# Patient Record
Sex: Female | Born: 1935 | Race: White | Hispanic: No | Marital: Married
Health system: Southern US, Community
[De-identification: ages and names within clinical notes are randomized; demographics above are authoritative.]

---

## 2004-10-14 ENCOUNTER — Ambulatory Visit: Payer: Self-pay | Admitting: Internal Medicine

## 2005-03-02 ENCOUNTER — Emergency Department: Payer: Self-pay | Admitting: Emergency Medicine

## 2005-04-15 ENCOUNTER — Ambulatory Visit: Payer: Self-pay | Admitting: Internal Medicine

## 2005-10-20 ENCOUNTER — Ambulatory Visit: Payer: Self-pay | Admitting: Internal Medicine

## 2006-10-22 ENCOUNTER — Ambulatory Visit: Payer: Self-pay | Admitting: Internal Medicine

## 2007-04-06 ENCOUNTER — Ambulatory Visit: Payer: Self-pay | Admitting: Internal Medicine

## 2007-04-29 ENCOUNTER — Ambulatory Visit: Payer: Self-pay | Admitting: Gastroenterology

## 2007-06-15 ENCOUNTER — Emergency Department: Payer: Self-pay | Admitting: Emergency Medicine

## 2007-06-15 ENCOUNTER — Other Ambulatory Visit: Payer: Self-pay

## 2007-06-16 ENCOUNTER — Ambulatory Visit: Payer: Self-pay | Admitting: Emergency Medicine

## 2007-10-25 ENCOUNTER — Ambulatory Visit: Payer: Self-pay | Admitting: Internal Medicine

## 2008-08-09 IMAGING — CR DG CHEST 2V
1 series · 2 of 2 positions shown · non-contrast
Comparison: none

REASON FOR EXAM: pain
COMMENTS:

[Series 1: view not recorded · 0.17mm/px · 2 of 2 slices shown]
[im 1/2]
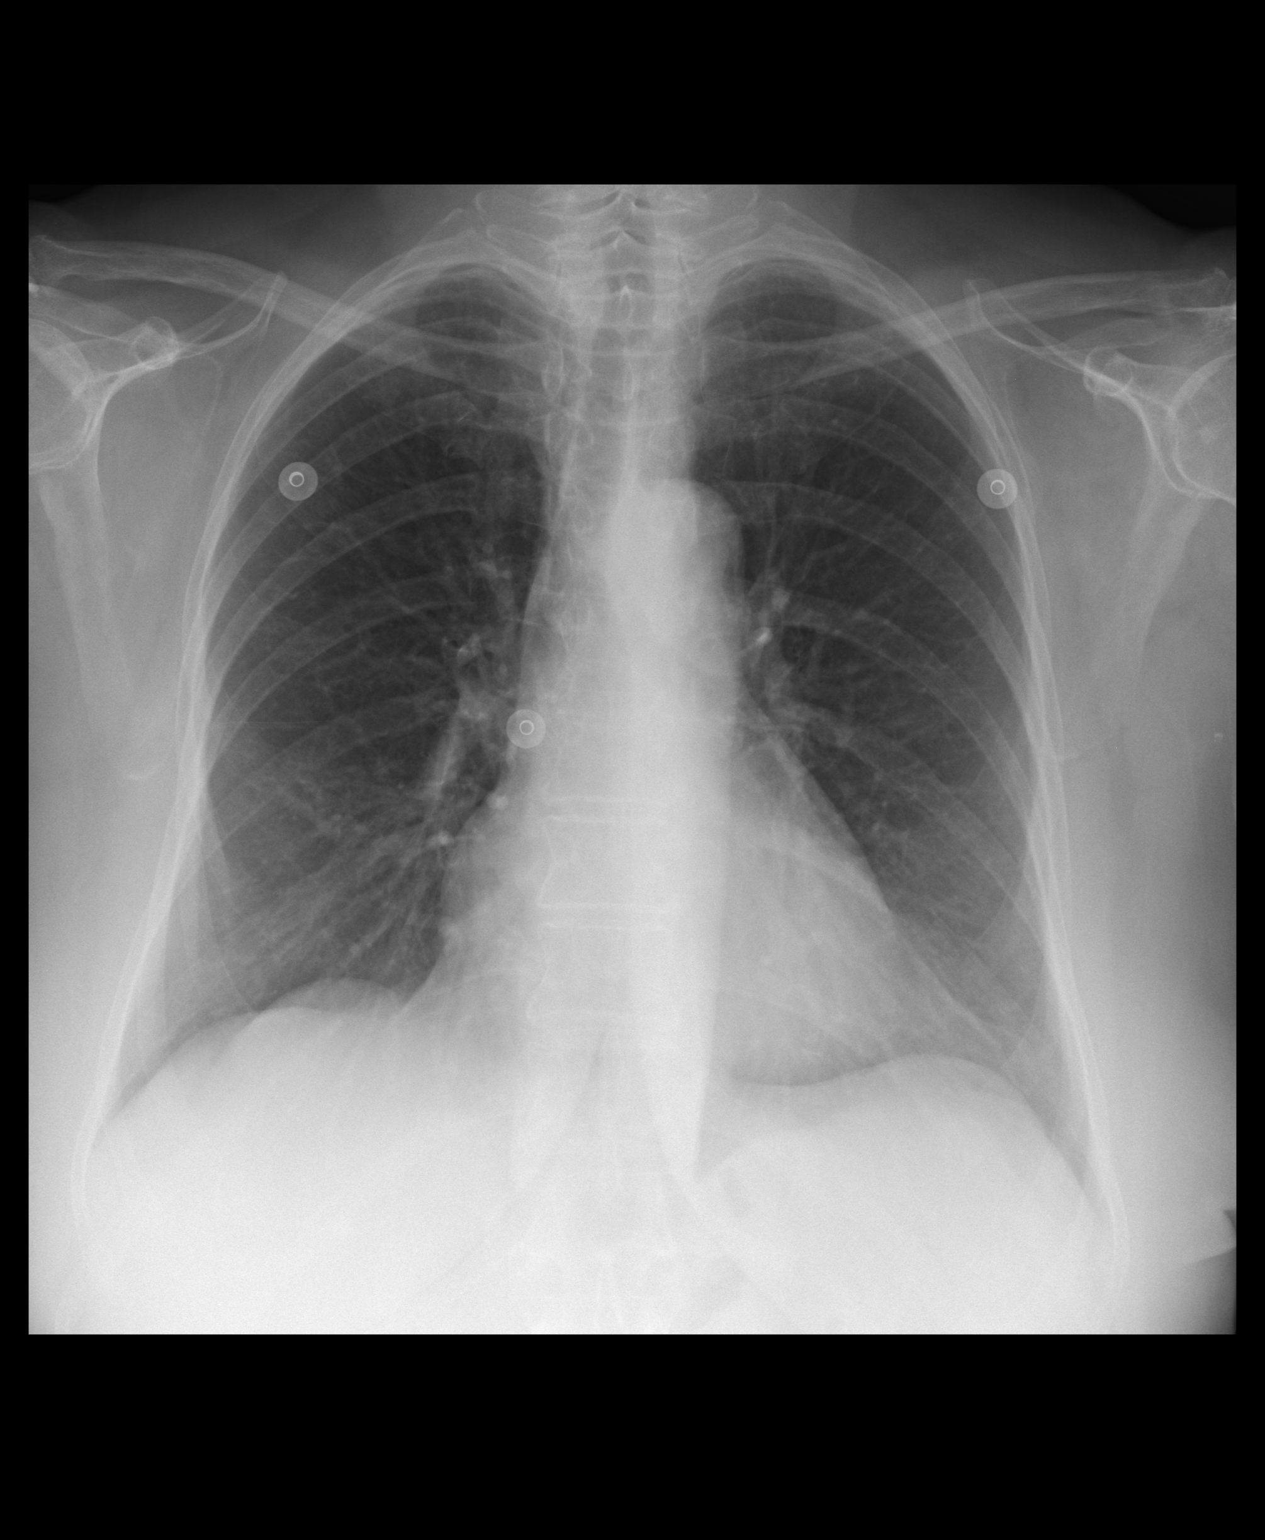
[im 2/2]
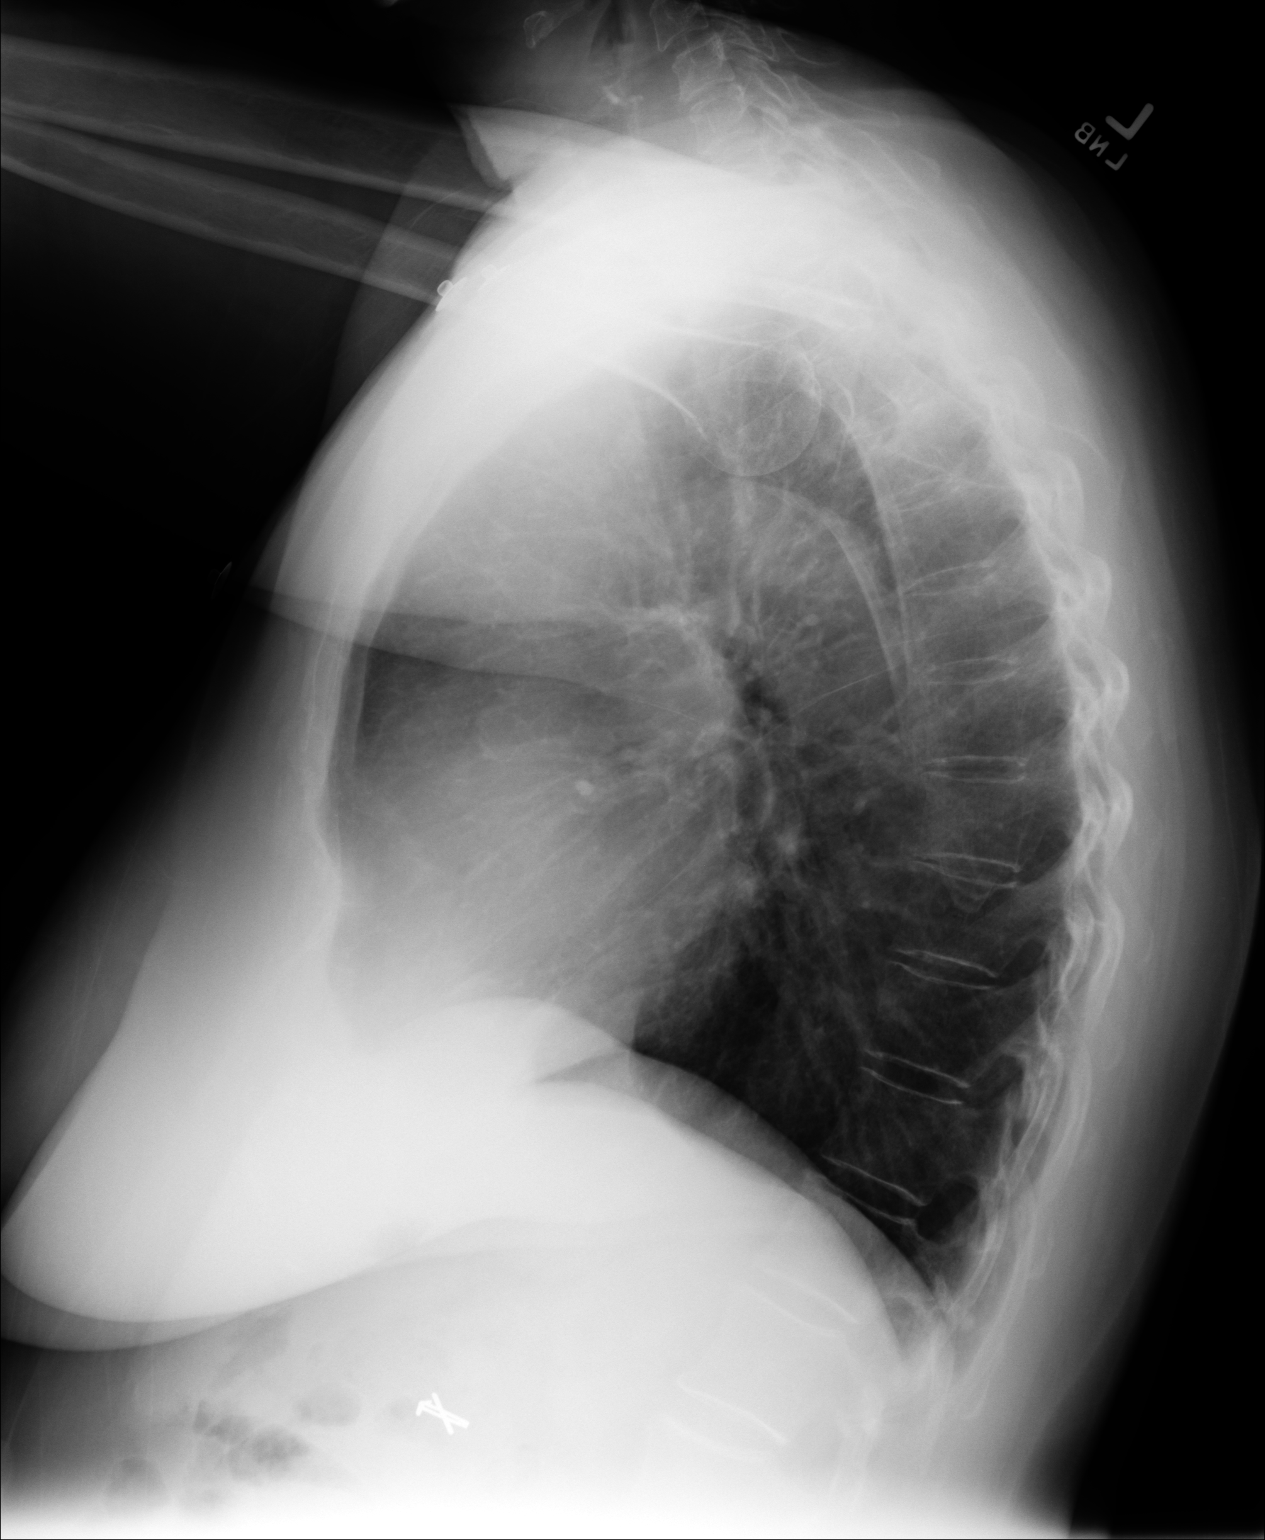

[2 of 2 positions shown; findings below may reference images not displayed]

PROCEDURE:     DXR - DXR CHEST PA (OR AP) AND LATERAL  - June 15, 2007  [DATE]

RESULT:     PA and lateral views of the chest show the lung fields to be
clear. No pneumonia, pneumothorax or pleural effusion is seen. Heart size is
normal. The mediastinal and osseous structures show no significant
abnormalities.
IMPRESSION: 1.     No significant abnormalities are noted.

## 2008-10-26 ENCOUNTER — Ambulatory Visit: Payer: Self-pay | Admitting: Internal Medicine

## 2010-02-14 ENCOUNTER — Ambulatory Visit: Payer: Self-pay | Admitting: Internal Medicine

## 2011-01-27 ENCOUNTER — Ambulatory Visit: Payer: Self-pay | Admitting: Internal Medicine

## 2011-02-19 ENCOUNTER — Ambulatory Visit: Payer: Self-pay | Admitting: Internal Medicine

## 2012-03-19 ENCOUNTER — Ambulatory Visit: Payer: Self-pay | Admitting: Internal Medicine

## 2012-05-30 ENCOUNTER — Emergency Department: Payer: Self-pay | Admitting: *Deleted

## 2012-10-15 ENCOUNTER — Emergency Department: Payer: Self-pay | Admitting: Emergency Medicine

## 2013-04-11 ENCOUNTER — Ambulatory Visit: Payer: Self-pay

## 2013-12-10 IMAGING — CR DG SHOULDER 3+V*R*
1 series · 3 of 3 positions shown · non-contrast
Comparison: none

REASON FOR EXAM: fall
COMMENTS:   May transport without cardiac monitor

PROCEDURE:     DXR - DXR SHOULDER RIGHT COMPLETE  - October 15, 2012  [DATE]
RESULT:

[Series 1: w shoulder internal right · 0.14mm/px · 3 of 3 slices shown]
[im 1/3]
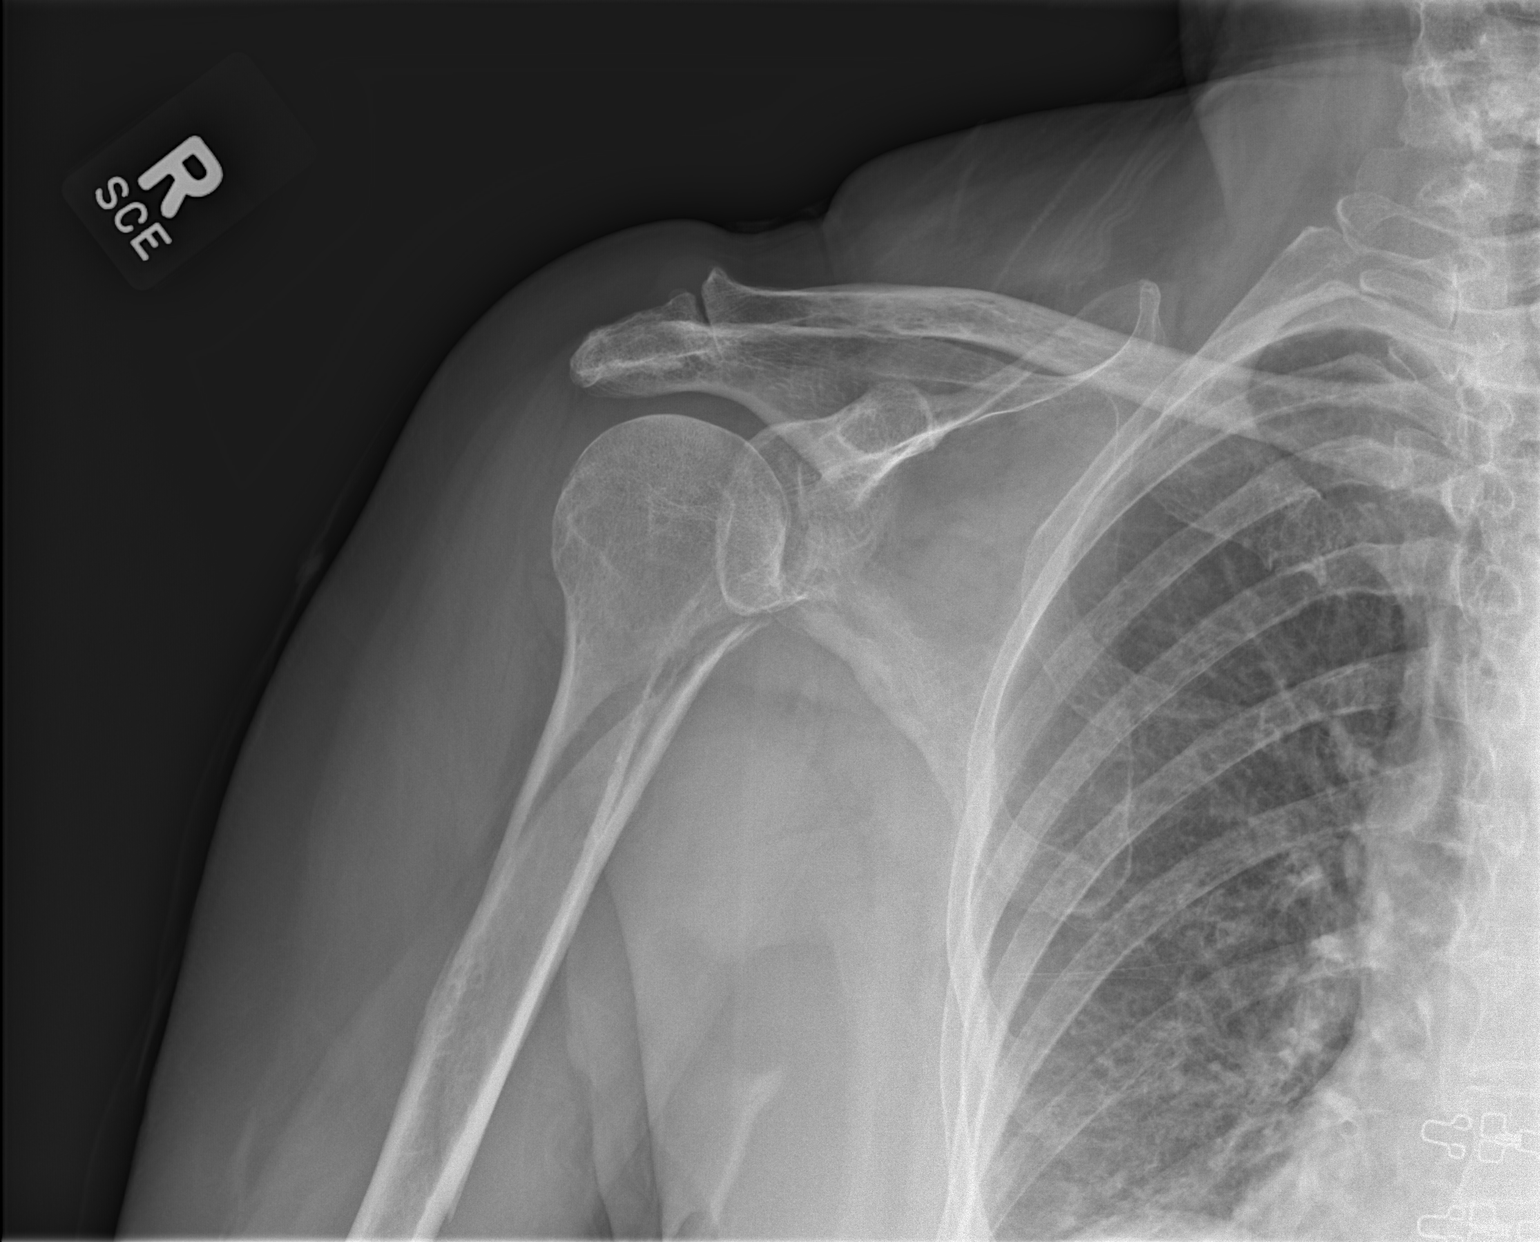
[im 2/3]
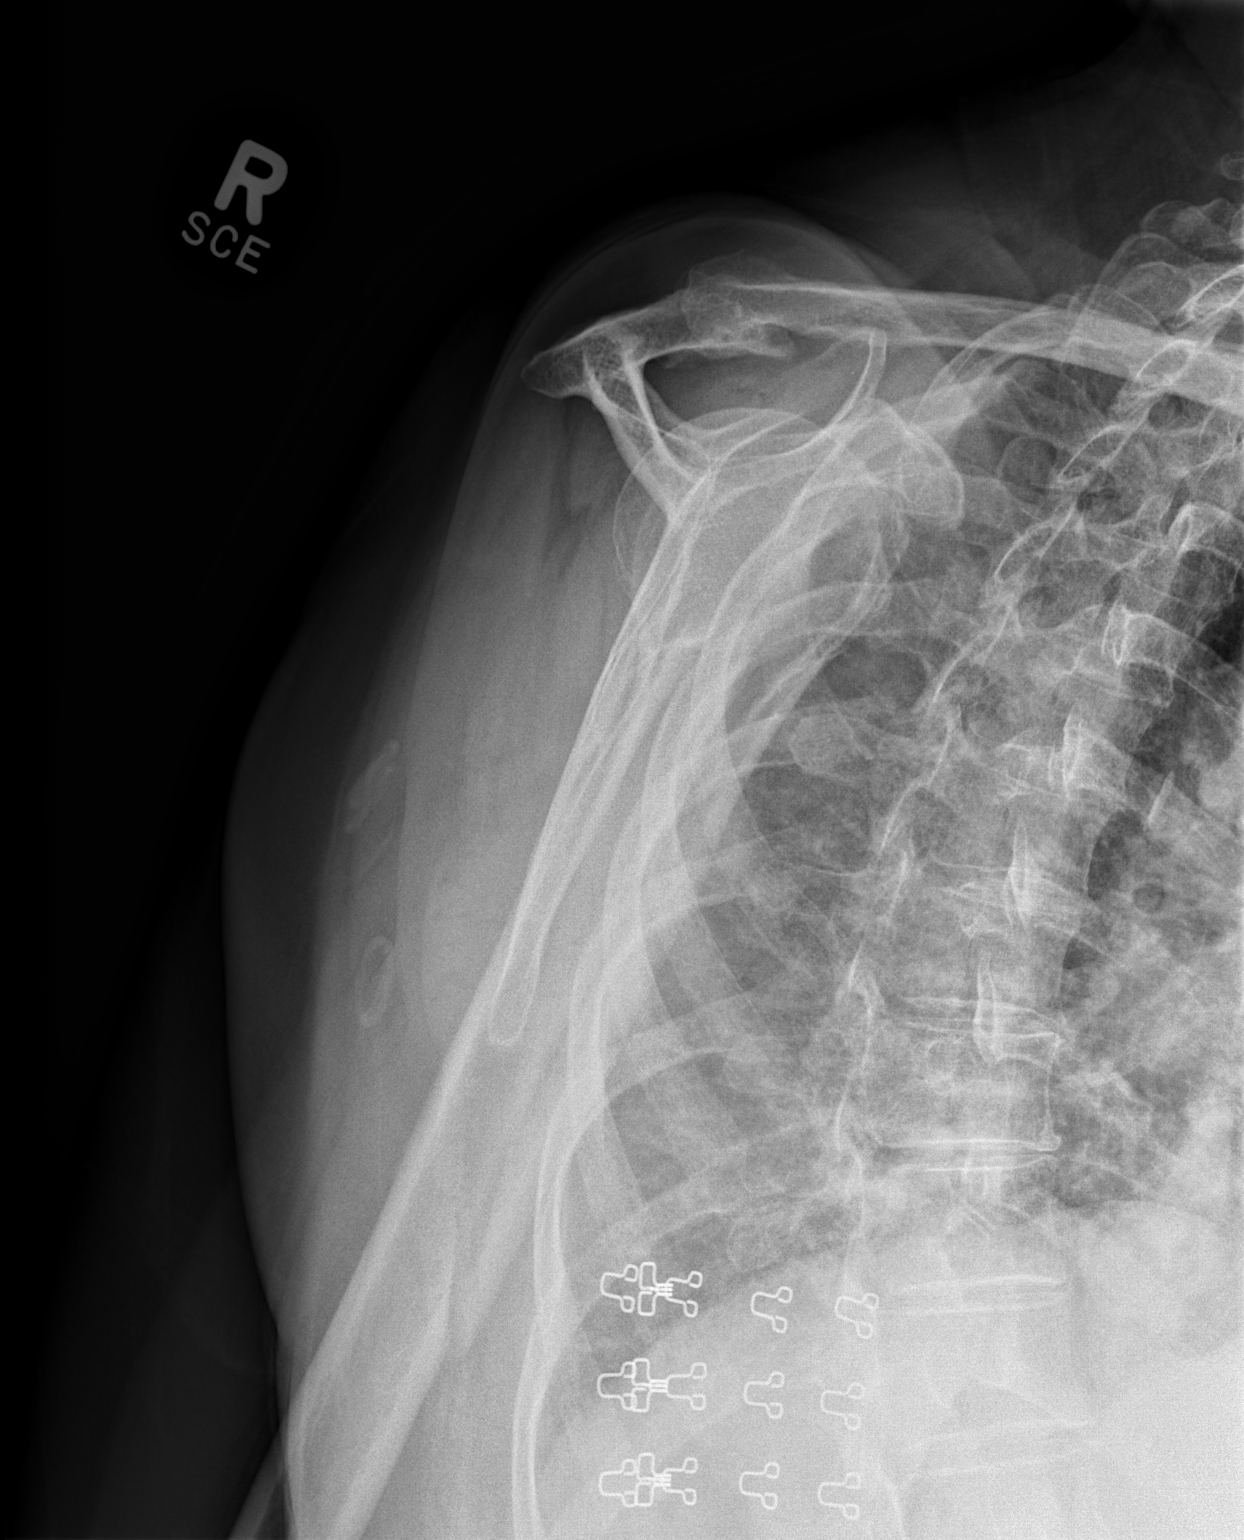
[im 3/3]
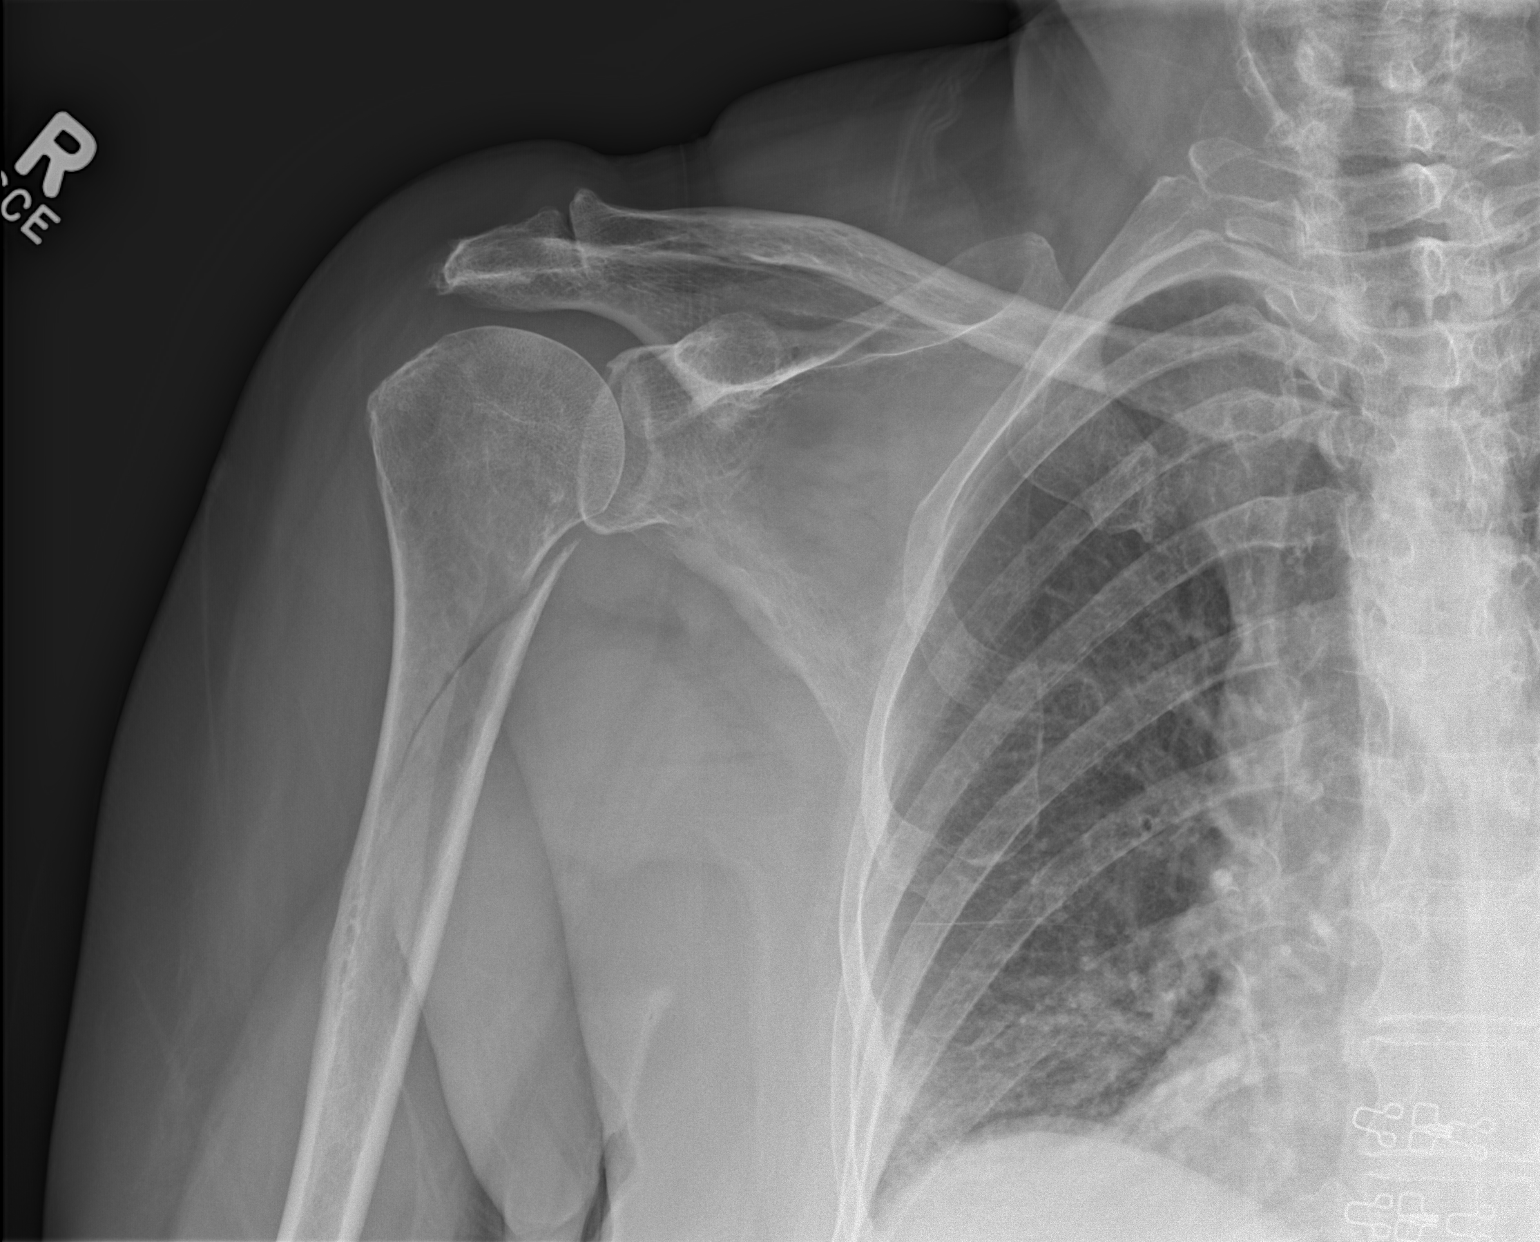

[3 of 3 positions shown; findings below may reference images not displayed]

FINDINGS: An oblique fracture is identified along the proximal humeral
shaft. There are also findings suspicious for possible nondisplaced glenoid
fracture.
IMPRESSION: Proximal oblique humeral shaft fracture as well as possibly
nondisplaced glenoid fracture.

## 2014-05-04 ENCOUNTER — Ambulatory Visit: Payer: Self-pay

## 2014-10-12 ENCOUNTER — Emergency Department: Payer: Self-pay | Admitting: Emergency Medicine

## 2014-10-12 LAB — COMPREHENSIVE METABOLIC PANEL
ALBUMIN: 4.2 g/dL (ref 3.4–5.0)
ANION GAP: 9 (ref 7–16)
Alkaline Phosphatase: 68 U/L
BILIRUBIN TOTAL: 0.5 mg/dL (ref 0.2–1.0)
BUN: 15 mg/dL (ref 7–18)
Calcium, Total: 9.1 mg/dL (ref 8.5–10.1)
Chloride: 102 mmol/L (ref 98–107)
Co2: 27 mmol/L (ref 21–32)
Creatinine: 0.89 mg/dL (ref 0.60–1.30)
EGFR (African American): >60
EGFR (Non-African Amer.): >60
Glucose: 91 mg/dL (ref 65–99)
Osmolality: 276 (ref 275–301)
Potassium: 3.4 mmol/L — ABNORMAL LOW (ref 3.5–5.1)
SGOT(AST): 29 U/L (ref 15–37)
SGPT (ALT): 41 U/L
SODIUM: 138 mmol/L (ref 136–145)
Total Protein: 8.2 g/dL (ref 6.4–8.2)

## 2014-10-12 LAB — CBC WITH DIFFERENTIAL/PLATELET
Basophil #: 0 10*3/uL (ref 0.0–0.1)
Basophil %: 0.5 %
EOS ABS: 0 10*3/uL (ref 0.0–0.7)
Eosinophil %: 0.3 %
HCT: 39.9 % (ref 35.0–47.0)
HGB: 13.3 g/dL (ref 12.0–16.0)
LYMPHS PCT: 27.8 %
Lymphocyte #: 2 10*3/uL (ref 1.0–3.6)
MCH: 31.8 pg (ref 26.0–34.0)
MCHC: 33.5 g/dL (ref 32.0–36.0)
MCV: 95 fL (ref 80–100)
MONOS PCT: 7.9 %
Monocyte #: 0.6 x10 3/mm (ref 0.2–0.9)
Neutrophil #: 4.5 10*3/uL (ref 1.4–6.5)
Neutrophil %: 63.5 %
Platelet: 282 10*3/uL (ref 150–440)
RBC: 4.19 10*6/uL (ref 3.80–5.20)
RDW: 13.5 % (ref 11.5–14.5)
WBC: 7.1 10*3/uL (ref 3.6–11.0)

## 2015-12-07 IMAGING — CT CT HEAD WITHOUT CONTRAST
1 series · 16 of 30 positions shown, 20 images · non-contrast
Comparison: None.

CLINICAL DATA: Left-sided facial numbness/ tingling, present
intermittently for 1 week. Current episode has been present all day.

EXAM:
CT HEAD WITHOUT CONTRAST
TECHNIQUE: Contiguous axial images were obtained from the base of the skull
through the vertex without intravenous contrast.

[Series 2: head wo · axial · 0.42mm/px · z∈[+381,+507]mm · 16 of 32 slices shown, 20 images]
[im 2/32  brain]
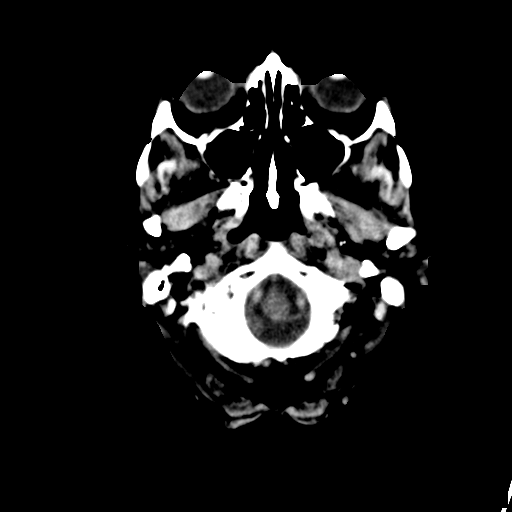
[im 2/32  bone]
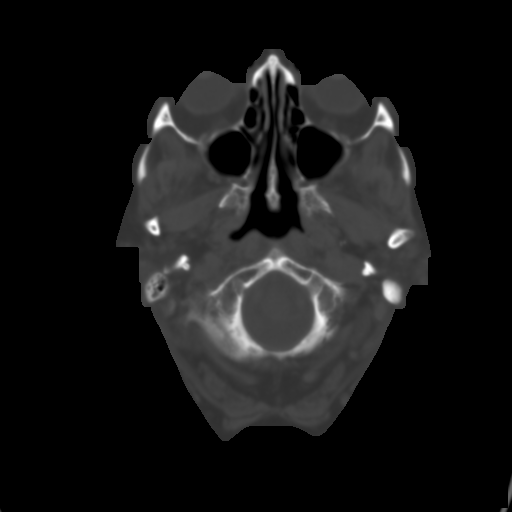
[im 4/32  brain]
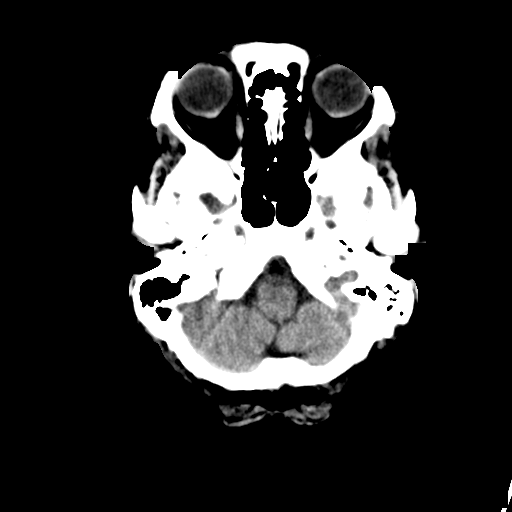
[im 6/32  brain]
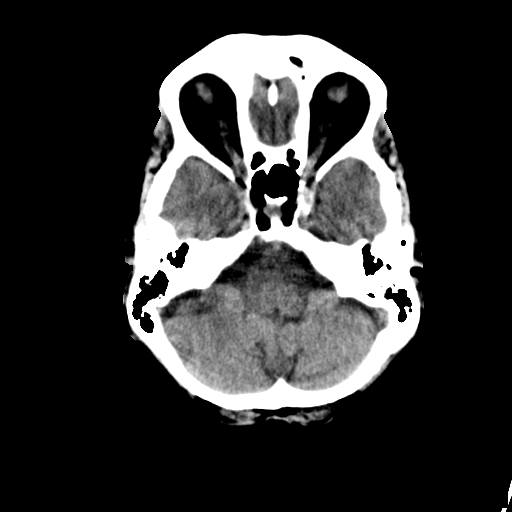
[im 8/32  brain]
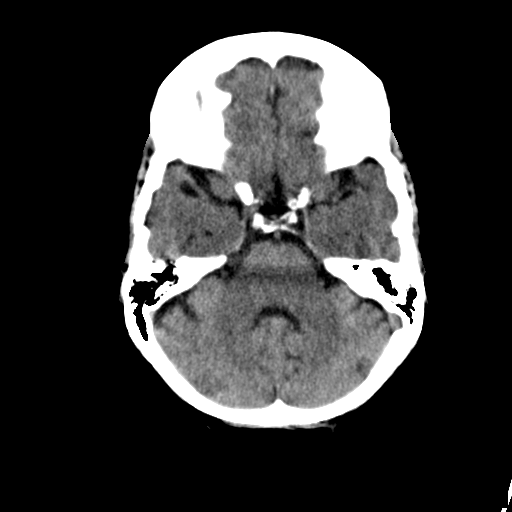
[im 9/32  brain]
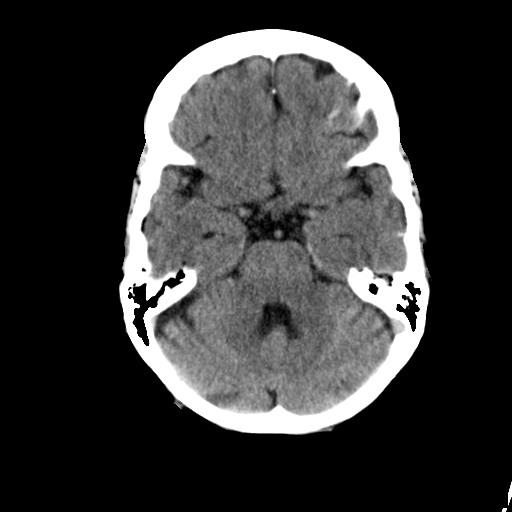
[im 9/32  bone]
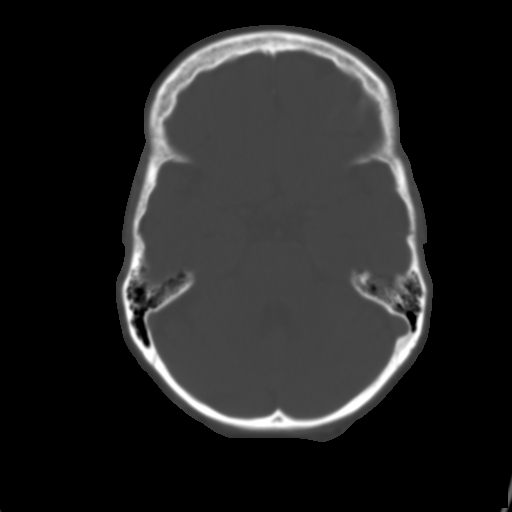
[im 11/32  brain]
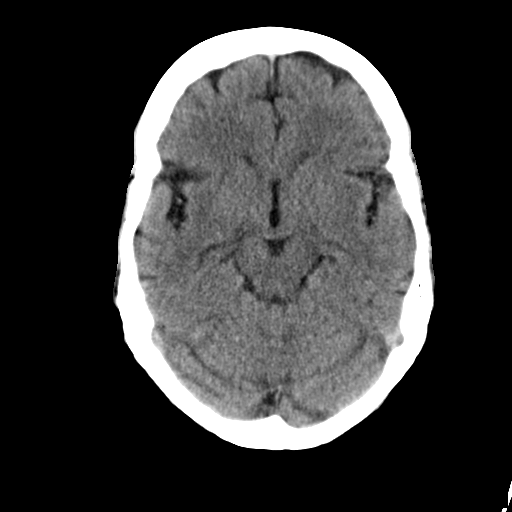
[im 13/32  brain]
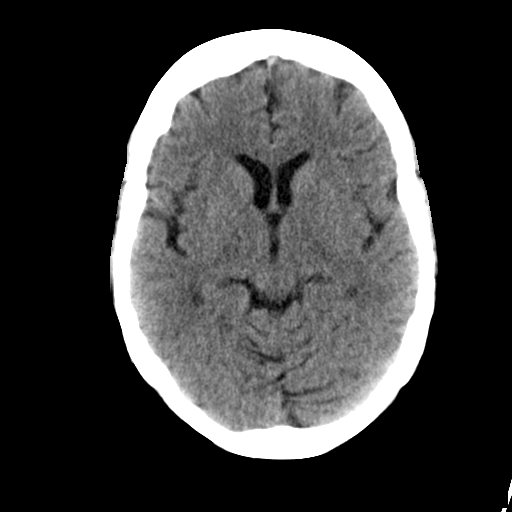
[im 15/32  brain]
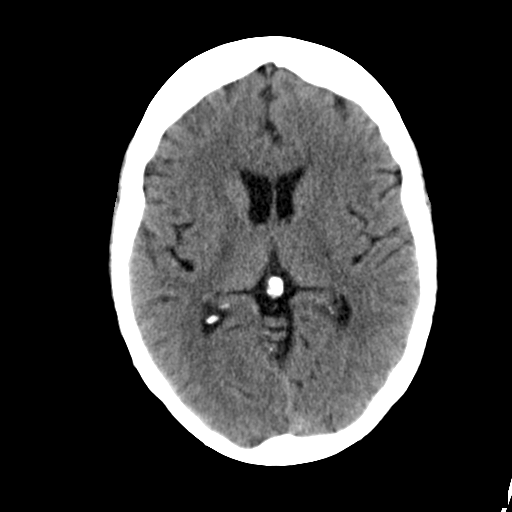
[im 17/32  brain]
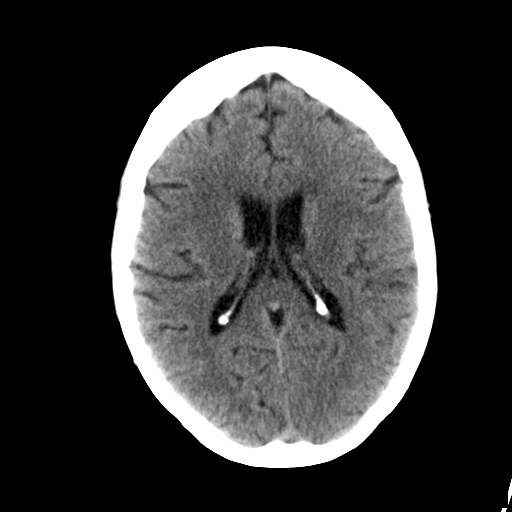
[im 17/32  bone]
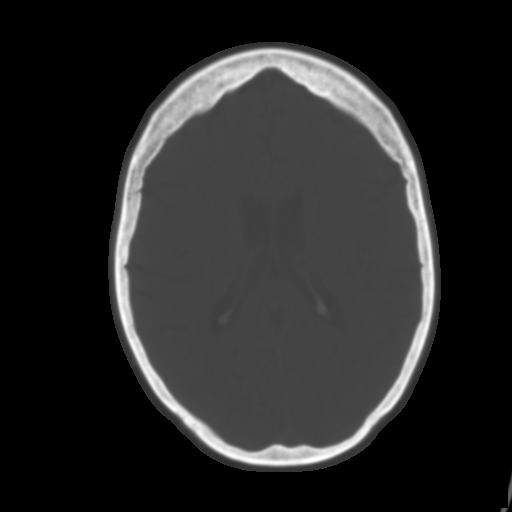
[im 19/32  brain]
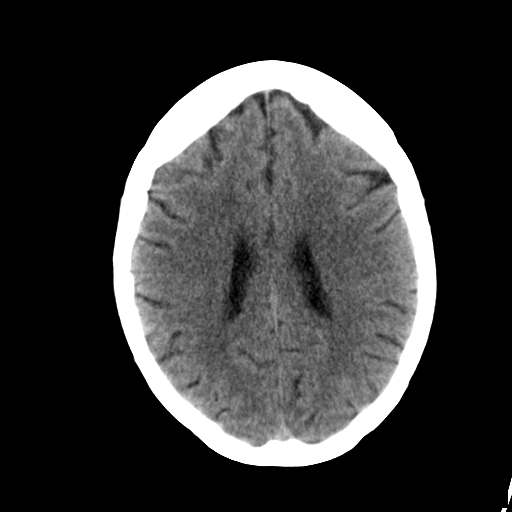
[im 21/32  brain]
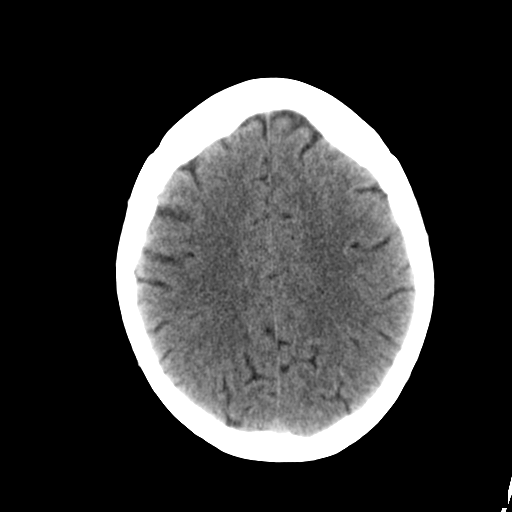
[im 23/32  brain]
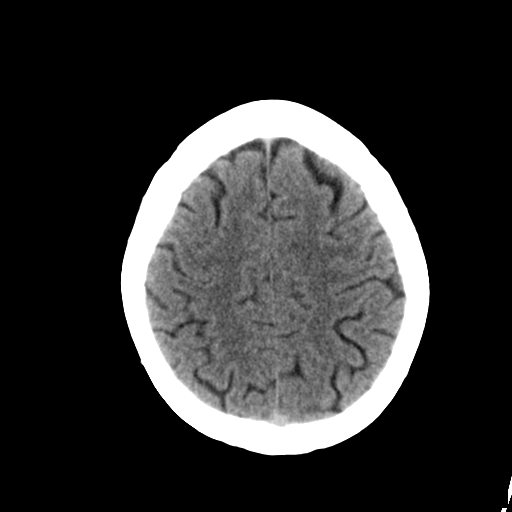
[im 24/32  brain]
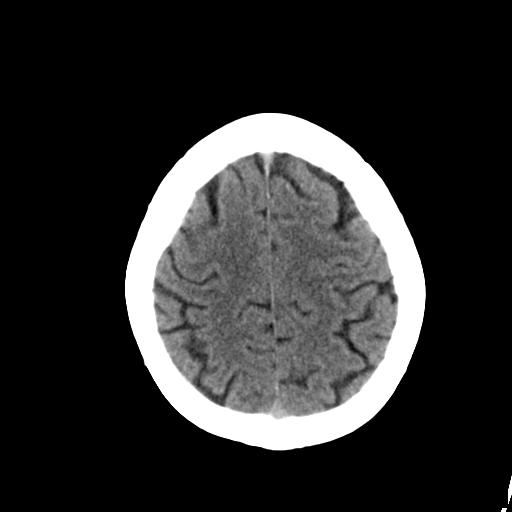
[im 24/32  bone]
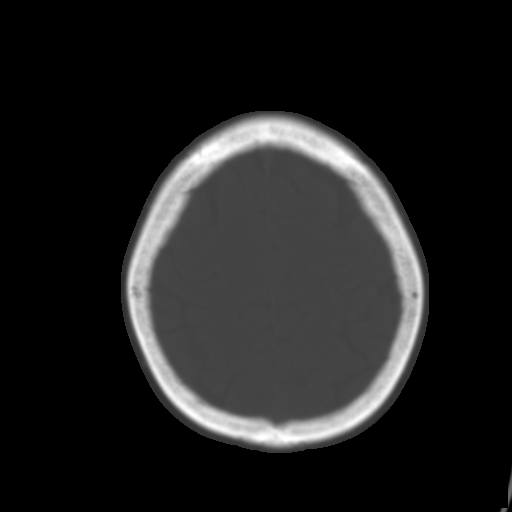
[im 26/32  brain]
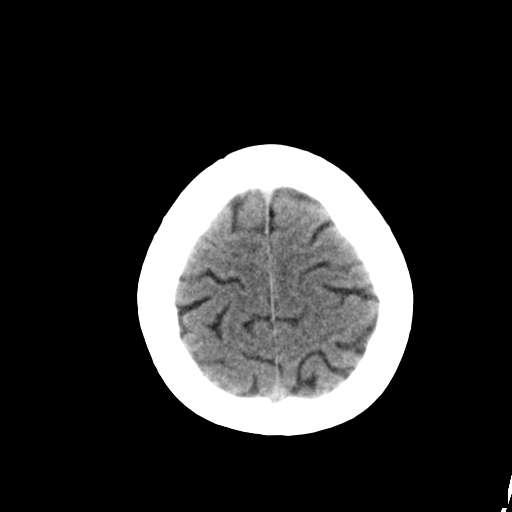
[im 28/32  brain]
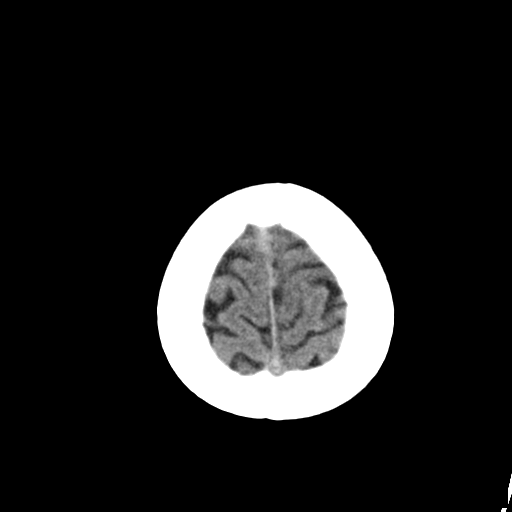
[im 30/32  brain]
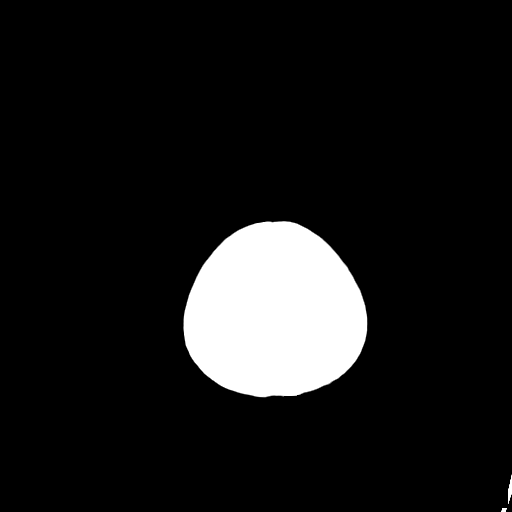

[16 of 30 positions shown; findings below may reference images not displayed]

FINDINGS: There is no evidence of acute cortical infarct, intracranial
hemorrhage, mass, midline shift, or extra-axial fluid collection.
There is mild cerebral atrophy, within normal limits for age.
Periventricular white-matter hypodensities are nonspecific but
compatible with mild chronic small vessel ischemic disease.

Orbits are unremarkable. Mastoid air cells and visualized paranasal
sinuses are clear.
IMPRESSION: No evidence of acute intracranial abnormality.

## 2019-09-12 ENCOUNTER — Ambulatory Visit: Payer: Self-pay | Admitting: Physical Therapy

## 2019-09-14 ENCOUNTER — Ambulatory Visit: Payer: Medicare Other | Attending: Neurology | Admitting: Physical Therapy

## 2019-09-14 ENCOUNTER — Other Ambulatory Visit: Payer: Self-pay

## 2019-09-14 ENCOUNTER — Encounter: Payer: Self-pay | Admitting: Physical Therapy

## 2019-09-14 DIAGNOSIS — R2689 Other abnormalities of gait and mobility: Secondary | ICD-10-CM | POA: Diagnosis present

## 2019-09-14 DIAGNOSIS — R2681 Unsteadiness on feet: Secondary | ICD-10-CM | POA: Diagnosis present

## 2019-09-14 NOTE — Therapy (Signed)
[place] MAIN [facility] SERVICES [address] [place], [place], [address] Phone: [phone]   Fax:  [phone]  Physical Therapy Evaluation  Patient Details  Name: Patty Sawyer MRN: [id number] Date of Birth: 1932-07-02 Referring Provider (PT): Harrel Lemon   Encounter Date: 09/14/2019  PT End of Session - 09/14/19 1740    Visit Number  1    Number of Visits  1    Date for PT Re-Evaluation  09/14/19   N/A   PT Start Time  6301    PT Stop Time  1530    PT Time Calculation (min)  59 min    Equipment Utilized During Treatment  Gait belt    Activity Tolerance  Patient tolerated treatment well;No increased pain    Behavior During Therapy  [facility] for tasks assessed/performed       History reviewed. No pertinent past medical history.  History reviewed. No pertinent surgical history.  There were no vitals filed for this visit.   Subjective Assessment - 09/14/19 1745    Subjective  Patient expressed that she did not feel PT was necessary, as her self-reported balance has been fine; reportedly, she had difficulty standing up from her chair at her last PCP visit due to reasons unrelated to her peripheral neuropathy.    Patient is accompained by:  Family member    Pertinent History  Patient is an 85 y.o. female with hx of hypertension, TIA (2015), benign essential tremor (head and bilateral hand), peripheral neuropathy of bilateral feet, FCBD, diverticulosis, left wrist fracture surgery. Reports to PT with imbalance due to peripheral neuropathy.    How long can you sit comfortably?  As long as desired, no deficits    How long can you stand comfortably?  Able to cook, clean, do laundry, and other standing tasks at home without needing any breaks.    How long can you walk comfortably?  Able to complete yardwork, go shopping/other errands without difficulty; experiences some N/T in bilateral great toe after a long day on her feet.    Patient Stated  Goals  N/A    Currently in Pain?  No/denies    Multiple Pain Sites  No         [facility] PT Assessment - 09/15/19 0001      Assessment   Medical Diagnosis  Peripheral Neuropathy    Referring Provider (PT)  Harrel Lemon    Hand Dominance  Right    Next MD Visit  01/18/2020    Prior Therapy  No      Precautions   Precautions  None      Restrictions   Weight Bearing Restrictions  No      Balance Screen   Has the patient fallen in the past 6 months  No    Has the patient had a decrease in activity level because of a fear of falling?   No    Is the patient reluctant to leave their home because of a fear of falling?   No      Home Social worker  Private residence    Living Arrangements  Spouse/significant other    Available Help at Discharge  Family    Type of [place] to enter    Entrance Stairs-Number of Steps  1    Entrance Stairs-Rails  None    Home Layout  Two level;Bed/bath upstairs    Alternate Level  Stairs-Number of Steps  6 steps each staircase    Alternate Level Stairs-Rails  Left    Home Equipment  None      Prior Function   Level of Independence  Independent    Vocation  Retired    GafferVocation Requirements  N/A    Leisure  Yardwork, watch TV      Cognition   Overall Cognitive Status  Within Functional Limits for tasks assessed        PAIN: No pain reported this date.  POSTURE: WNL   PROM/AROM: WNL  STRENGTH:  Graded on a 0-5 scale Muscle Group Left Right  Hip Flex 4 4  Hip Abd 4 4  Hip Add 4 4  Hip Ext 4 4+  Hip IR/ER 5 5  Knee Flex 5 5  Knee Ext 5 5  Ankle DF 5 5  Ankle PF 5 5   SENSATION: WNL; experiences numbness at ball of foot after a day of being on her feet.    FUNCTIONAL MOBILITY: WNL; Able to transfer Sit<>Stand with minimal use of UE; completes bed mobility independently with no difficulty.    BALANCE: WFL; some difficulty with tandem stance and SLS (6s each LE)   GAIT:  WNL;  Step-through gait pattern with arm swing, no AD, no appearance of limp; consistent step length between R & L. Some difficulty maintaining balance with tandem walk, able to self-correct.   OUTCOME MEASURES: TEST Outcome Interpretation  5 times sit<>stand 11.95 sec >60 yo, >15 sec indicates increased risk for falls  10 meter walk test         1.43 m/s <1.0 m/s indicates increased risk for falls; limited community ambulator  Timed up and Go         9.37  sec <14 sec indicates increased risk for falls  FGA        27/30 Low fall risk        Objective measurements completed on examination: See above findings.              PT Education - 09/14/19 1745    Education Details  POC    Person(s) Educated  Patient;Spouse    Methods  Explanation    Comprehension  Verbalized understanding           PT Long Term Goals - 09/14/19 1804      PT LONG TERM GOAL #1   Title  Patient verbalized understanding of current limitations in strength and balance as reported by PT.    Time  1    Period  Days    Status  Achieved    Target Date  09/14/19          Plan - 09/14/19 1758    Clinical Impression Statement  Patient is a pleasant 85 year old female who presents to PT eval for imbalance dx. Patient has slightly decreased BLE hip strength and difficulty maintaining SLS time on BLE. However is independent in functional ADLs and reports she is functioning at baseline. Outcomes do not indicate a falls risk. Physical therapy deemed unnecessary for this patient regarding present diagnosis. Thank you.    Personal Factors and Comorbidities  Comorbidity 2    Comorbidities  HTN, Hx of TIA.    Stability/Clinical Decision Making  Stable/Uncomplicated    Clinical Decision Making  Low    Rehab Potential  Good    PT Frequency  One time visit    PT Duration  --    PT  Treatment/Interventions  Other (comment)   Patient will not require PT interventions.   PT Next Visit Plan  Will not seek PT for  present dx.    PT Home Exercise Plan  N/A    Consulted and Agree with Plan of Care  Patient;Family member/caregiver    Family Member Consulted  Spouse       Patient will benefit from skilled therapeutic intervention in order to improve the following deficits and impairments:  Decreased balance, Impaired sensation, Decreased strength  Visit Diagnosis: Unsteadiness on feet  Other abnormalities of gait and mobility     Problem List There are no active problems to display for this patient.  Pacer Dorn A. Earlene Plater, SPT This entire session was performed under direct supervision and direction of a licensed therapist/therapist assistant . I have personally read, edited and approve of the note as written.  Trotter,Margaret PT, DPT 09/15/2019, 8:19 AM  [facility] MAIN [facility] SERVICES [address] [place], [place], [address] Phone: [phone]   Fax:  [phone]  Name: Patty Sawyer MRN: [id number] Date of Birth: December 13, 1931

## 2019-09-19 ENCOUNTER — Ambulatory Visit: Payer: Medicare Other | Admitting: Physical Therapy

## 2019-09-21 ENCOUNTER — Ambulatory Visit: Payer: Medicare Other | Admitting: Physical Therapy

## 2019-09-26 ENCOUNTER — Ambulatory Visit: Payer: Medicare Other | Admitting: Physical Therapy

## 2019-09-28 ENCOUNTER — Ambulatory Visit: Payer: Medicare Other | Admitting: Physical Therapy

## 2019-09-29 ENCOUNTER — Ambulatory Visit: Payer: Medicare Other | Admitting: Physical Therapy

## 2019-10-03 ENCOUNTER — Ambulatory Visit: Payer: Medicare Other | Admitting: Physical Therapy

## 2019-10-05 ENCOUNTER — Ambulatory Visit: Payer: Medicare Other | Admitting: Physical Therapy

## 2019-10-10 ENCOUNTER — Ambulatory Visit: Payer: Medicare Other | Admitting: Physical Therapy

## 2019-10-12 ENCOUNTER — Ambulatory Visit: Payer: Medicare Other | Admitting: Physical Therapy

## 2019-10-19 ENCOUNTER — Ambulatory Visit: Payer: Medicare Other | Admitting: Physical Therapy

## 2019-10-24 ENCOUNTER — Ambulatory Visit: Payer: Medicare Other | Admitting: Physical Therapy

## 2019-10-26 ENCOUNTER — Ambulatory Visit: Payer: Medicare Other | Admitting: Physical Therapy

## 2020-02-06 ENCOUNTER — Other Ambulatory Visit: Payer: Self-pay | Admitting: Neurology

## 2020-02-06 DIAGNOSIS — R4189 Other symptoms and signs involving cognitive functions and awareness: Secondary | ICD-10-CM

## 2020-02-16 ENCOUNTER — Ambulatory Visit: Payer: Medicare Other

## 2020-02-23 ENCOUNTER — Ambulatory Visit
Admission: RE | Admit: 2020-02-23 | Discharge: 2020-02-23 | Disposition: A | Discharge status: Home / Self Care | Payer: Medicare Other | Source: Ambulatory Visit | Attending: Neurology | Admitting: Neurology

## 2020-02-23 ENCOUNTER — Other Ambulatory Visit: Payer: Self-pay

## 2020-02-23 DIAGNOSIS — R4189 Other symptoms and signs involving cognitive functions and awareness: Secondary | ICD-10-CM

## 2020-05-16 ENCOUNTER — Other Ambulatory Visit: Payer: Self-pay

## 2020-05-16 ENCOUNTER — Other Ambulatory Visit: Payer: Self-pay | Admitting: Internal Medicine

## 2020-05-16 ENCOUNTER — Ambulatory Visit
Admission: RE | Admit: 2020-05-16 | Discharge: 2020-05-16 | Disposition: A | Discharge status: Home / Self Care | Payer: Medicare Other | Source: Ambulatory Visit | Attending: Internal Medicine | Admitting: Internal Medicine

## 2020-05-16 DIAGNOSIS — M79662 Pain in left lower leg: Secondary | ICD-10-CM

## 2020-06-18 ENCOUNTER — Other Ambulatory Visit: Payer: Self-pay | Admitting: Internal Medicine

## 2020-06-18 DIAGNOSIS — M79662 Pain in left lower leg: Secondary | ICD-10-CM

## 2020-06-21 ENCOUNTER — Other Ambulatory Visit: Payer: Self-pay

## 2020-06-21 ENCOUNTER — Ambulatory Visit
Admission: RE | Admit: 2020-06-21 | Discharge: 2020-06-21 | Disposition: A | Discharge status: Home / Self Care | Payer: Medicare Other | Source: Ambulatory Visit | Attending: Internal Medicine | Admitting: Internal Medicine

## 2020-06-21 DIAGNOSIS — M79662 Pain in left lower leg: Secondary | ICD-10-CM | POA: Diagnosis present

## 2021-01-30 ENCOUNTER — Encounter: Payer: Self-pay | Admitting: Dermatology

## 2021-01-30 ENCOUNTER — Other Ambulatory Visit: Payer: Self-pay

## 2021-01-30 ENCOUNTER — Ambulatory Visit (INDEPENDENT_AMBULATORY_CARE_PROVIDER_SITE_OTHER): Payer: Medicare Other | Admitting: Dermatology

## 2021-01-30 DIAGNOSIS — L570 Actinic keratosis: Secondary | ICD-10-CM | POA: Diagnosis not present

## 2021-01-30 DIAGNOSIS — L814 Other melanin hyperpigmentation: Secondary | ICD-10-CM | POA: Diagnosis not present

## 2021-01-30 DIAGNOSIS — L578 Other skin changes due to chronic exposure to nonionizing radiation: Secondary | ICD-10-CM

## 2021-01-30 DIAGNOSIS — L821 Other seborrheic keratosis: Secondary | ICD-10-CM

## 2021-01-30 NOTE — Progress Notes (Signed)
   New Patient Visit  Subjective  Patty Sawyer is a 87 y.o. female who presents for the following: check spot (Nose, months, no symptoms, no hx of skin ca). She would like other spots checked.  Patient accompanied by husband who contributes to history.  The following portions of the chart were reviewed this encounter and updated as appropriate:   Allergies  Meds  Problems  Med Hx  Surg Hx  Fam Hx     Review of Systems:  No other skin or systemic complaints except as noted in HPI or Assessment and Plan.  Objective  Well appearing patient in no apparent distress; mood and affect are within normal limits.  A focused examination was performed including face, scalp, ears, hands. Relevant physical exam findings are noted in the Assessment and Plan.  Objective  R nasal alar rim x 1, L medial cheek infraorbital x 1 (2): Pink scaly macules    Assessment & Plan    Actinic Damage - chronic, secondary to cumulative UV radiation exposure/sun exposure over time - diffuse scaly erythematous macules with underlying dyspigmentation - Recommend daily broad spectrum sunscreen SPF 30+ to sun-exposed areas, reapply every 2 hours as needed.  - Recommend staying in the shade or wearing long sleeves, sun glasses (UVA+UVB protection) and wide brim hats (4-inch brim around the entire circumference of the hat). - Call for new or changing lesions.  Seborrheic Keratoses - Stuck-on, waxy, tan-brown papules and plaques  - Discussed benign etiology and prognosis. - Observe - Call for any changes  Lentigines - Scattered tan macules - Due to sun exposure - Benign-appering, observe - Recommend daily broad spectrum sunscreen SPF 30+ to sun-exposed areas, reapply every 2 hours as needed. - Call for any changes  AK (actinic keratosis) (2) R nasal alar rim x 1, L medial cheek infraorbital x 1  If not clear after LN2 treatment in 2 months RTC  Destruction of lesion - R nasal alar rim x 1, L  medial cheek infraorbital x 1 Complexity: simple   Destruction method: cryotherapy   Informed consent: discussed and consent obtained   Timeout:  patient name, date of birth, surgical site, and procedure verified Lesion destroyed using liquid nitrogen: Yes   Region frozen until ice ball extended beyond lesion: Yes   Outcome: patient tolerated procedure well with no complications   Post-procedure details: wound care instructions given    Return in about 6 months (around 08/02/2021) for AK f/u.  I, Ardis Rowan, RMA, am acting as scribe for Armida Sans, MD .  Documentation: I have reviewed the above documentation for accuracy and completeness, and I agree with the above.  Armida Sans, MD

## 2021-08-15 ENCOUNTER — Ambulatory Visit: Payer: Medicare Other | Admitting: Dermatology
